# Patient Record
Sex: Male | Born: 2000 | Race: White | Hispanic: Yes | Marital: Single | Smoking: Current some day smoker
Health system: Southern US, Community
[De-identification: ages and names within clinical notes are randomized; demographics above are authoritative.]

## PROBLEM LIST (undated history)

## (undated) DIAGNOSIS — E785 Hyperlipidemia, unspecified: Secondary | ICD-10-CM

## (undated) DIAGNOSIS — T7840XA Allergy, unspecified, initial encounter: Secondary | ICD-10-CM

## (undated) HISTORY — DX: Allergy, unspecified, initial encounter: T78.40XA

## (undated) HISTORY — DX: Hyperlipidemia, unspecified: E78.5

---
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
## 2018-05-01 ENCOUNTER — Ambulatory Visit (INDEPENDENT_AMBULATORY_CARE_PROVIDER_SITE_OTHER): Payer: 59 | Admitting: Medical

## 2018-05-01 ENCOUNTER — Encounter: Payer: Self-pay | Admitting: Medical

## 2018-05-01 VITALS — BP 132/58 | HR 53 | Temp 98.3°F | Resp 16 | Ht 66.0 in | Wt 178.4 lb

## 2018-05-01 DIAGNOSIS — Z23 Encounter for immunization: Secondary | ICD-10-CM

## 2018-05-01 DIAGNOSIS — E785 Hyperlipidemia, unspecified: Secondary | ICD-10-CM

## 2018-05-01 DIAGNOSIS — J302 Other seasonal allergic rhinitis: Secondary | ICD-10-CM

## 2018-05-01 DIAGNOSIS — Z8639 Personal history of other endocrine, nutritional and metabolic disease: Secondary | ICD-10-CM | POA: Diagnosis not present

## 2018-05-01 LAB — COMPREHENSIVE METABOLIC PANEL
ALT: 19 U/L (ref 0–53)
AST: 18 U/L (ref 0–37)
Albumin: 4.7 g/dL (ref 3.5–5.2)
Alkaline Phosphatase: 128 U/L (ref 52–171)
BILIRUBIN TOTAL: 0.6 mg/dL (ref 0.3–1.2)
BUN: 13 mg/dL (ref 6–23)
CHLORIDE: 103 meq/L (ref 96–112)
CO2: 31 meq/L (ref 19–32)
Calcium: 9.8 mg/dL (ref 8.4–10.5)
Creatinine, Ser: 0.86 mg/dL (ref 0.40–1.50)
GFR: 121.78 mL/min (ref >60.00)
GLUCOSE: 87 mg/dL (ref 70–99)
POTASSIUM: 4 meq/L (ref 3.5–5.1)
Sodium: 139 mEq/L (ref 135–145)
Total Protein: 7.1 g/dL (ref 6.0–8.3)

## 2018-05-01 LAB — LIPID PANEL
CHOL/HDL RATIO: 4
Cholesterol: 140 mg/dL (ref 0–200)
HDL: 37.1 mg/dL — AB (ref >39.00)
LDL CALC: 91 mg/dL (ref 0–99)
NONHDL: 103.09
Triglycerides: 58 mg/dL (ref 0.0–149.0)
VLDL: 11.6 mg/dL (ref 0.0–40.0)

## 2018-05-01 NOTE — Progress Notes (Signed)
Subjective:    Patient ID: Devon Whitehead, male    DOB: Jun 19, 2001, 17 y.o.   MRN: [id number]  HPI   Pt in for first time.  Pt states just to get established. No chronic medical problems except maybe high cholesterol & allergies in spring.  Pt states he will get flu vaccine today.  Pt used to work out regularly. But has not been to gym in a month. Pt graduated high school in 2018. Pt is working at [employer]. He works second and 3rd shift. Occasional smoker. No alcohol use.    Pt states he was told he had high cholesterol in middle school. He is fasting today. He states maybe back then he was eating a lot of eggs.     Review of Systems  Constitutional: Negative for chills, fatigue and fever.  HENT: Negative for congestion, ear pain and hearing loss.   Respiratory: Negative for cough, chest tightness, shortness of breath and wheezing.   Cardiovascular: Negative for chest pain and palpitations.  Gastrointestinal: Negative for abdominal pain.  Musculoskeletal: Negative for back pain.  Skin: Negative for rash.  Neurological: Negative for dizziness, weakness, numbness and headaches.  Hematological: Negative for adenopathy. Does not bruise/bleed easily.  Psychiatric/Behavioral: Negative for behavioral problems and confusion.    Past Medical History:  Diagnosis Date  . Allergy    mild spring allergies.  . Hyperlipidemia      Social History   Socioeconomic History  . Marital status: Single    Spouse name: Not on file  . Number of children: Not on file  . Years of education: Not on file  . Highest education level: Not on file  Occupational History  . Not on file  Social Needs  . Financial resource strain: Not on file  . Food insecurity:    Worry: Not on file    Inability: Not on file  . Transportation needs:    Medical: Not on file    Non-medical: Not on file  Tobacco Use  . Smoking status: Current Some Day Smoker  . Smokeless tobacco: Never Used    . Tobacco comment: he states rare occasional black and mild. occasional juule  Substance and Sexual Activity  . Alcohol use: No  . Drug use: Never  . Sexual activity: Yes  Lifestyle  . Physical activity:    Days per week: Not on file    Minutes per session: Not on file  . Stress: Not on file  Relationships  . Social connections:    Talks on phone: Not on file    Gets together: Not on file    Attends religious service: Not on file    Active member of club or organization: Not on file    Attends meetings of clubs or organizations: Not on file    Relationship status: Not on file  . Intimate partner violence:    Fear of current or ex partner: Not on file    Emotionally abused: Not on file    Physically abused: Not on file    Forced sexual activity: Not on file  Other Topics Concern  . Not on file  Social History Narrative   ** Merged History Encounter **        History reviewed. No pertinent surgical history.  Family History  Problem Relation Age of Onset  . Diabetes Father     No Known Allergies  No current outpatient medications on file prior to visit.   No current facility-administered  medications on file prior to visit.     BP (!) 132/58   Pulse (!) 53   Temp 98.3 F (36.8 C) (Oral)   Resp 16   Ht 5\' 6"  (1.676 m)   Wt 178 lb 6.4 oz (80.9 kg)   SpO2 100%   BMI 28.79 kg/m       Objective:   Physical Exam  General Mental Status- Alert. General Appearance- Not in acute distress.   Chest and Lung Exam Auscultation: Breath Sounds:-Normal.  Cardiovascular Auscultation:Rythm- Regular. Murmurs & Other Heart Sounds:Auscultation of the heart reveals- No Murmurs.  Abdomen Inspection:-Inspeection Normal. Palpation/Percussion:Note:No mass. Palpation and Percussion of the abdomen reveal- Non Tender, Non Distended + BS, no rebound or guarding.  Neurologic Cranial Nerve exam:- CN III-XII intact(No nystagmus), symmetric smile. Strength:- 5/5 equal and  symmetric strength both upper and lower extremities.      Assessment & Plan:  .Good to meet you today.   You mentioned high cholesterol history so will get lipid panel and cmp today.   We gave you flu vaccine today.   If you need to be seen in future for acute illness please call and get scheduled.  For allergies in spring recommend you try  over the counter xyzal and flonase.  Follow up as needed.  Esperanza Richters, PA-C

## 2018-05-01 NOTE — Patient Instructions (Addendum)
Good to meet you today.   You mentioned high cholesterol history so will get lipid panel and cmp today.   We gave you flu vaccine today.   If you need to be seen in future for acute illness please call and get scheduled.  For allergies in spring recommend you try  over the counter xyzal and flonase.  Follow up as needed.

## 2018-05-23 ENCOUNTER — Other Ambulatory Visit: Payer: Self-pay

## 2018-05-23 ENCOUNTER — Ambulatory Visit (INDEPENDENT_AMBULATORY_CARE_PROVIDER_SITE_OTHER): Payer: 59 | Admitting: Family Medicine

## 2018-05-23 ENCOUNTER — Encounter: Payer: Self-pay | Admitting: Family Medicine

## 2018-05-23 VITALS — BP 128/72 | HR 57 | Temp 98.1°F | Resp 16 | Ht 66.01 in | Wt 180.6 lb

## 2018-05-23 DIAGNOSIS — G4482 Headache associated with sexual activity: Secondary | ICD-10-CM

## 2018-05-23 DIAGNOSIS — N5319 Other ejaculatory dysfunction: Secondary | ICD-10-CM

## 2018-05-23 NOTE — Patient Instructions (Addendum)
   If you have lab work done today you will be contacted with your lab results within the next 2 weeks.  If you have not heard from us then please contact us. The fastest way to get your results is to register for My Chart.   IF you received an x-ray today, you will receive an invoice from [facility]. Please contact [facility] at [phone] with questions or concerns regarding your invoice.   IF you received labwork today, you will receive an invoice from [facility]. Please contact [facility] at [phone] with questions or concerns regarding your invoice.   Our billing staff will not be able to assist you with questions regarding bills from these companies.  You will be contacted with the lab results as soon as they are available. The fastest way to get your results is to activate your My Chart account. Instructions are located on the last page of this paperwork. If you have not heard from us regarding the results in 2 weeks, please contact this office.     Migraine Headache A migraine headache is an intense, throbbing pain on one side or both sides of the head. Migraines may also cause other symptoms, such as nausea, vomiting, and sensitivity to light and noise. What are the causes? Doing or taking certain things may also trigger migraines, such as:  Alcohol.  Smoking.  Medicines, such as: ? Medicine used to treat chest pain (nitroglycerine). ? Birth control pills. ? Estrogen pills. ? Certain blood pressure medicines.  Aged cheeses, chocolate, or caffeine.  Foods or drinks that contain nitrates, glutamate, aspartame, or tyramine.  Physical activity.  Other things that may trigger a migraine include:  Menstruation.  Pregnancy.  Hunger.  Stress, lack of sleep, too much sleep, or fatigue.  Weather changes.  What increases the risk? The following factors may make you more likely to experience migraine headaches:  Age. Risk increases with  age.  Family history of migraine headaches.  Being Caucasian.  Depression and anxiety.  Obesity.  Being a woman.  Having a hole in the heart (patent foramen ovale) or other heart problems.  What are the signs or symptoms? The main symptom of this condition is pulsating or throbbing pain. Pain may:  Happen in any area of the head, such as on one side or both sides.  Interfere with daily activities.  Get worse with physical activity.  Get worse with exposure to bright lights or loud noises.  Other symptoms may include:  Nausea.  Vomiting.  Dizziness.  General sensitivity to bright lights, loud noises, or smells.  Before you get a migraine, you may get warning signs that a migraine is developing (aura). An aura may include:  Seeing flashing lights or having blind spots.  Seeing bright spots, halos, or zigzag lines.  Having tunnel vision or blurred vision.  Having numbness or a tingling feeling.  Having trouble talking.  Having muscle weakness.  How is this diagnosed? A migraine headache can be diagnosed based on:  Your symptoms.  A physical exam.  Tests, such as CT scan or MRI of the head. These imaging tests can help rule out other causes of headaches.  Taking fluid from the spine (lumbar puncture) and analyzing it (cerebrospinal fluid analysis, or CSF analysis).  How is this treated? A migraine headache is usually treated with medicines that:  Relieve pain.  Relieve nausea.  Prevent migraines from coming back.  Treatment may also include:  Acupuncture.  Lifestyle changes like avoiding   foods that trigger migraines.  Follow these instructions at home: Medicines  Take over-the-counter and prescription medicines only as told by your health care provider.  Do not drive or use heavy machinery while taking prescription pain medicine.  To prevent or treat constipation while you are taking prescription pain medicine, your health care provider  may recommend that you: ? Drink enough fluid to keep your urine clear or pale yellow. ? Take over-the-counter or prescription medicines. ? Eat foods that are high in fiber, such as fresh fruits and vegetables, whole grains, and beans. ? Limit foods that are high in fat and processed sugars, such as fried and sweet foods. Lifestyle  Avoid alcohol use.  Do not use any products that contain nicotine or tobacco, such as cigarettes and e-cigarettes. If you need help quitting, ask your health care provider.  Get at least 8 hours of sleep every night.  Limit your stress. General instructions   Keep a journal to find out what may trigger your migraine headaches. For example, write down: ? What you eat and drink. ? How much sleep you get. ? Any change to your diet or medicines.  If you have a migraine: ? Avoid things that make your symptoms worse, such as bright lights. ? It may help to lie down in a dark, quiet room. ? Do not drive or use heavy machinery. ? Ask your health care provider what activities are safe for you while you are experiencing symptoms.  Keep all follow-up visits as told by your health care provider. This is important. Contact a health care provider if:  You develop symptoms that are different or more severe than your usual migraine symptoms. Get help right away if:  Your migraine becomes severe.  You have a fever.  You have a stiff neck.  You have vision loss.  Your muscles feel weak or like you cannot control them.  You start to lose your balance often.  You develop trouble walking.  You faint. This information is not intended to replace advice given to you by your health care provider. Make sure you discuss any questions you have with your health care provider. Document Released: 07/09/2005 Document Revised: 01/27/2016 Document Reviewed: 12/26/2015 [employer]vier Interactive Patient Education  2017 [employer]  

## 2018-05-23 NOTE — Progress Notes (Signed)
Chief Complaint  Patient presents with  . Headache    x 5 days-started on Sunday 05/18/18, emesis twice on tuesday and once yesterday.  Nauseous feeling like he wants to throw up but can't, churning in stomach like he is hungry but he's not.  Sensitivity to sound with ha's, pain level 10/10 when ha starts goes down to 5/10 and then ha goes away.  Per pt took excedrin for ha's and it helped some, tylenol for ha today.  Monday night into tuesday morning unable to sleep due to ha.  Ejaculation and gagging as if he is about to vomit triggers his headaches.    HPI  Patient reports that he has been having headaches for 5 days He feels very nausous The headache has resolved He reports that he gets both at work He denies new foods, alcohol or marijuana He states that on Sunday night 5 days ago he was about to ejaculate and had a pain in the back of his head He reports that after he had to lay down He reports that the headache was pulsating but seems to improve  He reports that he now has repeated episode of churning in his head like he was hungry He felt like he wanted to throw up and so he gagged really hard and felt like it hip him  When the headache is present it is a 10/10 He states that the worse one was at the time of onset He reports that he gets headaches repeatedly when he is about to ejaculate    Past Medical History:  Diagnosis Date  . Allergy    mild spring allergies.  . Hyperlipidemia     Current Outpatient Medications  Medication Sig Dispense Refill  . acetaminophen (TYLENOL) 500 MG tablet Take 500 mg by mouth every 6 (six) hours as needed.    . SUMAtriptan (IMITREX) 50 MG tablet Take 1 tablet (50 mg total) by mouth every 2 (two) hours as needed for migraine. May repeat in 2 hours if headache persists or recurs. 10 tablet 3   No current facility-administered medications for this visit.     Allergies: No Known Allergies  No past surgical history on file.  Social  History   Socioeconomic History  . Marital status: Single    Spouse name: Not on file  . Number of children: Not on file  . Years of education: Not on file  . Highest education level: Not on file  Occupational History  . Not on file  Social Needs  . Financial resource strain: Not on file  . Food insecurity:    Worry: Not on file    Inability: Not on file  . Transportation needs:    Medical: Not on file    Non-medical: Not on file  Tobacco Use  . Smoking status: Current Some Day Smoker  . Smokeless tobacco: Never Used  . Tobacco comment: he states rare occasional black and mild. occasional juule  Substance and Sexual Activity  . Alcohol use: No  . Drug use: Never  . Sexual activity: Yes  Lifestyle  . Physical activity:    Days per week: Not on file    Minutes per session: Not on file  . Stress: Not on file  Relationships  . Social connections:    Talks on phone: Not on file    Gets together: Not on file    Attends religious service: Not on file    Active member of club or organization: Not on  file    Attends meetings of clubs or organizations: Not on file    Relationship status: Not on file  Other Topics Concern  . Not on file  Social History Narrative   ** Merged History Encounter **        Family History  Problem Relation Age of Onset  . Diabetes Father      ROS Review of Systems See HPI Constitution: No fevers or chills No malaise No diaphoresis Skin: No rash or itching Eyes: no blurry vision, no double vision GU: no dysuria or hematuria Neuro: see hpi all others reviewed and negative   Objective: Vitals:   05/23/18 1243  BP: 128/72  Pulse: (!) 57  Resp: 16  Temp: 98.1 F (36.7 C)  TempSrc: Oral  SpO2: 98%  Weight: 180 lb 9.6 oz (81.9 kg)  Height: 5' 6.01" (1.677 m)    Physical Exam  Constitutional: He is oriented to person, place, and time. He appears well-developed and well-nourished.  HENT:  Head: Normocephalic and atraumatic.    Cardiovascular: Normal rate, regular rhythm and normal heart sounds.  Pulmonary/Chest: Effort normal and breath sounds normal. No stridor. No respiratory distress. He has no wheezes. He has no rales.  Abdominal: Soft. Bowel sounds are normal. He exhibits no distension and no mass. There is no tenderness. There is no guarding.  Neurological: He is alert and oriented to person, place, and time.    Eye exam: PERRL, EOM intact bilaterally, normal fundoscopic exam without papilledema or av nicking  CRANIAL NERVES: CN II: Visual fields are full to confrontation. Fundoscopic exam is normal with sharp discs and no vascular changes. Pupils are round equal and briskly reactive to light. CN III, IV, VI: extraocular movement are normal. No ptosis. CN V: Facial sensation is intact to pinprick in all 3 divisions bilaterally. Corneal responses are intact.  CN VII: Face is symmetric with normal eye closure and smile. CN VIII: Hearing is normal to rubbing fingers CN IX, X: Palate elevates symmetrically. Phonation is normal. CN XI: Head turning and shoulder shrug are intact CN XII: Tongue is midline with normal movements and no atrophy.  Assessment and Plan Press was seen today for headache.  Diagnoses and all orders for this visit:  Disorder of ejaculation  Orgasmic headache  Other orders -     SUMAtriptan (IMITREX) 50 MG tablet; Take 1 tablet (50 mg total) by mouth every 2 (two) hours as needed for migraine. May repeat in 2 hours if headache persists or recurs.  pt advised to avoid masturbating in hot showers To maintain hydration To take imitrex for headaches To keep a headache diary and follow up in clinic prn   Oluwatobi Visser A Creta Levin

## 2018-05-24 MED ORDER — SUMATRIPTAN SUCCINATE 50 MG PO TABS: 50.0000 mg | ORAL_TABLET | ORAL | 3 refills | Status: DC | PRN

## 2018-06-25 ENCOUNTER — Ambulatory Visit: Payer: 59 | Admitting: Family Medicine

## 2018-09-18 ENCOUNTER — Ambulatory Visit (INDEPENDENT_AMBULATORY_CARE_PROVIDER_SITE_OTHER): Payer: 59 | Admitting: Medical

## 2018-09-18 ENCOUNTER — Encounter: Payer: Self-pay | Admitting: Medical

## 2018-09-18 VITALS — BP 118/59 | HR 55 | Temp 97.4°F | Resp 16 | Ht 66.0 in | Wt 175.2 lb

## 2018-09-18 DIAGNOSIS — J069 Acute upper respiratory infection, unspecified: Secondary | ICD-10-CM | POA: Diagnosis not present

## 2018-09-18 DIAGNOSIS — G4700 Insomnia, unspecified: Secondary | ICD-10-CM | POA: Diagnosis not present

## 2018-09-18 DIAGNOSIS — H6690 Otitis media, unspecified, unspecified ear: Secondary | ICD-10-CM

## 2018-09-18 MED ORDER — AMOXICILLIN-POT CLAVULANATE 875-125 MG PO TABS: 1.0000 | ORAL_TABLET | Freq: Two times a day (BID) | ORAL | 0 refills | Status: AC

## 2018-09-18 MED ORDER — HYDROXYZINE HCL 25 MG PO TABS: ORAL_TABLET | ORAL | 0 refills | Status: AC

## 2018-09-18 MED ORDER — FLUTICASONE PROPIONATE 50 MCG/ACT NA SUSP: 2.0000 | Freq: Every day | NASAL | 1 refills | Status: AC

## 2018-09-18 NOTE — Progress Notes (Signed)
Subjective:    Patient ID: Devon Whitehead, male    DOB: Jun 17, 2001, 18 y.o.   MRN: [id number]  HPI  Pt in with some recent left ear discomfort. He states one day mild transient sharp pain. Now has muffled sensation on and off. Symptoms for 2 days. He states this followed nasal congestion, cough and runny nose. Now he states he feels better over all except residual nasal congestion and faint left ear sensations as described above.  Hx of ear infections when younger.   Review of Systems  Constitutional: Negative for chills, fatigue and fever.  HENT: Positive for congestion and ear pain. Negative for sinus pressure, sinus pain and sore throat.        See hpi.  Respiratory: Negative for chest tightness, shortness of breath and wheezing.   Cardiovascular: Negative for chest pain and palpitations.  Gastrointestinal: Negative for abdominal pain.  Musculoskeletal: Negative for back pain.  Skin: Negative for rash.  Neurological: Negative for dizziness, weakness and light-headedness.  Hematological: Negative for adenopathy. Does not bruise/bleed easily.  Psychiatric/Behavioral: Positive for sleep disturbance. Negative for behavioral problems, confusion and suicidal ideas.    Past Medical History:  Diagnosis Date  . Allergy    mild spring allergies.  . Hyperlipidemia      Social History   Socioeconomic History  . Marital status: Single    Spouse name: Not on file  . Number of children: Not on file  . Years of education: Not on file  . Highest education level: Not on file  Occupational History  . Not on file  Social Needs  . Financial resource strain: Not on file  . Food insecurity:    Worry: Not on file    Inability: Not on file  . Transportation needs:    Medical: Not on file    Non-medical: Not on file  Tobacco Use  . Smoking status: Current Some Day Smoker  . Smokeless tobacco: Never Used  . Tobacco comment: he states rare occasional black and mild. occasional juule    Substance and Sexual Activity  . Alcohol use: No  . Drug use: Never  . Sexual activity: Yes  Lifestyle  . Physical activity:    Days per week: Not on file    Minutes per session: Not on file  . Stress: Not on file  Relationships  . Social connections:    Talks on phone: Not on file    Gets together: Not on file    Attends religious service: Not on file    Active member of club or organization: Not on file    Attends meetings of clubs or organizations: Not on file    Relationship status: Not on file  . Intimate partner violence:    Fear of current or ex partner: Not on file    Emotionally abused: Not on file    Physically abused: Not on file    Forced sexual activity: Not on file  Other Topics Concern  . Not on file  Social History Narrative   ** Merged History Encounter **        No past surgical history on file.  Family History  Problem Relation Age of Onset  . Diabetes Father     No Known Allergies  No current outpatient medications on file prior to visit.   No current facility-administered medications on file prior to visit.     BP (!) 118/59   Pulse (!) 55   Temp (!) 97.4 F (  36.3 C) (Oral)   Resp 16   Ht 5\' 6"  (1.676 m)   Wt 175 lb 3.2 oz (79.5 kg)   SpO2 100%   BMI 28.28 kg/m       Objective:   Physical Exam  General  Mental Status - Alert. General Appearance - Well groomed. Not in acute distress.  Skin Rashes- No Rashes.  HEENT Head- Normal. Ear Auditory Canal - Left- Normal. Right - Normal.Tympanic Membrane- Left- Normal. Right- Normal. Eye Sclera/Conjunctiva- Left- Normal. Right- Normal. Nose & Sinuses Nasal Mucosa- Left-  Boggy and Congested. Right-  Boggy and  Congested.Bilateral maxillary and frontal sinus pressure. Mouth & Throat Lips: Upper Lip- Normal: no dryness, cracking, pallor, cyanosis, or vesicular eruption. Lower Lip-Normal: no dryness, cracking, pallor, cyanosis or vesicular eruption. Buccal Mucosa- Bilateral- No  Aphthous ulcers. Oropharynx- No Discharge or Erythema. Tonsils: Characteristics- Bilateral- No Erythema or Congestion. Size/Enlargement- Bilateral- No enlargement. Discharge- bilateral-None.  Neck Neck- Supple. No Masses.   Chest and Lung Exam Auscultation: Breath Sounds:-Clear even and unlabored.  Cardiovascular Auscultation:Rythm- Regular, rate and rhythm. Murmurs & Other Heart Sounds:Ausculatation of the heart reveal- No Murmurs.  Lymphatic Head & Neck General Head & Neck Lymphatics: Bilateral: Description- No Localized lymphadenopathy.       Assessment & Plan:  You had recent uri with subsequent left ear infection.  Will rx flonase for nasal congestion and augmentin antibiotic for the ear infection.  You should gradually get better but if not let us know.   Follow up in 7-10 for any residual symptoms or sooner if needed  At end of exam as gave avs. He brought up insomnia. I rx'd hydroxyzine to take if needed when he gets back home from work late after midnight.(additional time spent discussing)  Esperanza Richters, PA-C

## 2018-09-18 NOTE — Patient Instructions (Addendum)
You had recent uri with subsequent left ear infection.  Will rx flonase for nasal congestion and augmentin antibiotic for the ear infection.  You should gradually get better but if not let us know.   Follow up in 7-10 for any residual symptoms or sooner if needed  At end of exam as gave avs. He brought up insomnia. I rx'd hydroxyzine to take if needed when he gets back home from work late after midnight.(addtional time spent discussing)
